# Patient Record
Sex: Female | Born: 1980
Health system: Southern US, Community
[De-identification: ages and names within clinical notes are randomized; demographics above are authoritative.]

## PROBLEM LIST (undated history)

## (undated) DIAGNOSIS — I10 Essential (primary) hypertension: Secondary | ICD-10-CM

## (undated) HISTORY — PX: APPENDECTOMY: SHX54

## (undated) HISTORY — PX: TUBAL LIGATION: SHX77

---

## 2008-03-21 ENCOUNTER — Emergency Department: Payer: Self-pay | Admitting: Emergency Medicine

## 2011-03-03 ENCOUNTER — Inpatient Hospital Stay: Payer: Self-pay

## 2011-03-05 LAB — PATHOLOGY REPORT

## 2011-03-06 DIAGNOSIS — I10 Essential (primary) hypertension: Secondary | ICD-10-CM

## 2011-05-10 ENCOUNTER — Inpatient Hospital Stay: Payer: Self-pay | Admitting: Surgery

## 2011-05-15 LAB — PATHOLOGY REPORT

## 2011-05-20 ENCOUNTER — Inpatient Hospital Stay: Payer: Self-pay | Admitting: Surgery

## 2012-06-18 ENCOUNTER — Encounter (HOSPITAL_COMMUNITY): Payer: Self-pay | Admitting: Emergency Medicine

## 2012-06-18 ENCOUNTER — Emergency Department (HOSPITAL_COMMUNITY)
Admission: EM | Admit: 2012-06-18 | Discharge: 2012-06-18 | Disposition: A | Discharge status: Home / Self Care | Payer: Self-pay | Attending: Emergency Medicine | Admitting: Emergency Medicine

## 2012-06-18 DIAGNOSIS — Z87891 Personal history of nicotine dependence: Secondary | ICD-10-CM | POA: Insufficient documentation

## 2012-06-18 DIAGNOSIS — J029 Acute pharyngitis, unspecified: Secondary | ICD-10-CM | POA: Insufficient documentation

## 2012-06-18 LAB — RAPID STREP SCREEN (MED CTR MEBANE ONLY): Streptococcus, Group A Screen (Direct): NEGATIVE

## 2012-06-18 MED ORDER — HYDROCODONE-ACETAMINOPHEN 7.5-500 MG/15ML PO SOLN: 15.0000 mL | Freq: Four times a day (QID) | ORAL | Status: DC | PRN

## 2012-06-18 MED ORDER — ACETAMINOPHEN-CODEINE 120-12 MG/5ML PO SOLN
10.0000 mL | Freq: Once | ORAL | Status: DC
  Filled 2012-06-18: qty 10

## 2012-06-18 MED ORDER — PENICILLIN V POTASSIUM 500 MG PO TABS: 500.0000 mg | ORAL_TABLET | Freq: Three times a day (TID) | ORAL | Status: AC

## 2012-06-18 MED ORDER — METHYLPREDNISOLONE SODIUM SUCC 125 MG IJ SOLR
125.0000 mg | Freq: Once | INTRAMUSCULAR | Status: AC
  Administered 2012-06-18: 125 mg via INTRAMUSCULAR
  Filled 2012-06-18: qty 2

## 2012-06-18 NOTE — ED Provider Notes (Signed)
History     CSN: [id number]  Arrival date & time 06/18/12  1941   First MD Initiated Contact with Patient 06/18/12 1959      Chief Complaint  Patient presents with  . Sore Throat    (Consider location/radiation/quality/duration/timing/severity/associated sxs/prior treatment) HPI Pt to the ED with complaints of bilateral sore throat for 3 days. She states it feels like burning and hurts to swallow. She denies having fevers, chills, nausea, vomiting and diarrhea. She denies nasal congestion or ear pain. No change in voice. VSS/NAD  History reviewed. No pertinent past medical history.  Past Surgical History  Procedure Date  . Appendectomy   . Tubal ligation     History reviewed. No pertinent family history.  History  Substance Use Topics  . Smoking status: Former Games developer  . Smokeless tobacco: Not on file   Comment: quit in 2011  . Alcohol Use: No    OB History    Grav Para Term Preterm Abortions TAB SAB Ect Mult Living                  Review of Systems  Review of Systems  Gen: no weight loss, fevers, chills, night sweats  Eyes: no discharge or drainage, no occular pain or visual changes  Nose: no epistaxis or rhinorrhea  Mouth: no dental pain, + sore throat  Neck: no neck pain  Lungs:No wheezing, coughing or hemoptysis CV: no chest pain, palpitations, dependent edema or orthopnea  Abd: no abdominal pain, nausea, vomiting  GU: no dysuria or gross hematuria  MSK:  No abnormalities  Neuro: no headache, no focal neurologic deficits  Skin: no abnormalities Psyche: negative.   Allergies  Review of patient's allergies indicates no known allergies.  Home Medications   Current Outpatient Rx  Name Route Sig Dispense Refill  . BC HEADACHE POWDER PO Oral Take 1 packet by mouth daily as needed. For pain    . HYDROCODONE-ACETAMINOPHEN 7.5-500 MG/15ML PO SOLN Oral Take 15 mLs by mouth every 6 (six) hours as needed for pain. 120 mL 0  . PENICILLIN V POTASSIUM 500 MG  PO TABS Oral Take 1 tablet (500 mg total) by mouth 3 (three) times daily. 30 tablet 0    BP 173/121  Pulse 97  Temp 98.5 F (36.9 C) (Oral)  Resp 18  SpO2 98%  LMP 05/24/2012  Physical Exam  Nursing note and vitals reviewed. Constitutional: She is oriented to person, place, and time. She appears well-developed and well-nourished. No distress.  HENT:  Head: Normocephalic and atraumatic. No trismus in the jaw.  Right Ear: Tympanic membrane, external ear and ear canal normal.  Left Ear: Tympanic membrane, external ear and ear canal normal.  Nose: Nose normal. No rhinorrhea. Right sinus exhibits no maxillary sinus tenderness and no frontal sinus tenderness. Left sinus exhibits no maxillary sinus tenderness and no frontal sinus tenderness.  Mouth/Throat: Uvula is midline and mucous membranes are normal. Normal dentition. No dental abscesses or uvula swelling. Oropharyngeal exudate and posterior oropharyngeal edema present. No posterior oropharyngeal erythema or tonsillar abscesses.       No submental edema, tongue not elevated, no trismus. No impending airway obstruction; Pt able to speak full sentences, swallow intact, no drooling, stridor, or tonsillar/uvula displacement. No palatal petechia  Eyes: Conjunctivae normal are normal.  Neck: Trachea normal, normal range of motion and full passive range of motion without pain. Neck supple. No rigidity. Normal range of motion present. No Brudzinski's sign noted.  Flexion and extension of neck without pain or difficulty. Able to breath without difficulty in extension.  Cardiovascular: Normal rate and regular rhythm.   Pulmonary/Chest: Effort normal and breath sounds normal. No stridor. No respiratory distress. She has no wheezes.  Abdominal: Soft. There is no tenderness.       No obvious evidence of splenomegaly. Non ttp.   Musculoskeletal: Normal range of motion.  Lymphadenopathy:       Head (right side): No preauricular and no posterior  auricular adenopathy present.       Head (left side): No preauricular and no posterior auricular adenopathy present.    She has cervical adenopathy.  Neurological: She is alert and oriented to person, place, and time.  Skin: Skin is warm and dry. No rash noted. She is not diaphoretic.  Psychiatric: She has a normal mood and affect.    ED Course  Procedures (including critical care time)   Labs Reviewed  RAPID STREP SCREEN   No results found.   1. Pharyngitis       MDM  Pt given shot of solumedrol in ED. Rx for Lortab elixir. Given Penicillin Rx and told not to take it unless pain lasts 3 more days then she should get it filled as this is most likely viral.  Pt has been advised of the symptoms that warrant their return to the ED. Patient has voiced understanding and has agreed to follow-up with the PCP or specialist.         Dorthula Matas, PA 06/18/12 2144

## 2012-06-18 NOTE — ED Notes (Signed)
Pt reports sore throat X 3days--feels like burning sensation; denies congestion

## 2012-06-20 NOTE — ED Provider Notes (Signed)
Medical screening examination/treatment/procedure(s) were performed by non-physician practitioner and as supervising physician I was immediately available for consultation/collaboration.   Ornella Coderre, MD 06/20/12 0649 

## 2014-08-10 ENCOUNTER — Ambulatory Visit: Payer: Self-pay | Admitting: Specialist

## 2014-10-04 ENCOUNTER — Emergency Department: Payer: Self-pay | Admitting: Emergency Medicine

## 2014-10-04 LAB — COMPREHENSIVE METABOLIC PANEL
ALBUMIN: 3.5 g/dL (ref 3.4–5.0)
ALK PHOS: 86 U/L
ALT: 52 U/L
ANION GAP: 9 (ref 7–16)
AST: 34 U/L (ref 15–37)
BUN: 12 mg/dL (ref 7–18)
Bilirubin,Total: <0.1 mg/dL — ABNORMAL LOW (ref 0.2–1.0)
Calcium, Total: 8.9 mg/dL (ref 8.5–10.1)
Chloride: 107 mmol/L (ref 98–107)
Co2: 23 mmol/L (ref 21–32)
Creatinine: 0.98 mg/dL (ref 0.60–1.30)
EGFR (Non-African Amer.): >60
GLUCOSE: 82 mg/dL (ref 65–99)
Osmolality: 276 (ref 275–301)
POTASSIUM: 4.3 mmol/L (ref 3.5–5.1)
Sodium: 139 mmol/L (ref 136–145)
TOTAL PROTEIN: 8.4 g/dL — AB (ref 6.4–8.2)

## 2014-10-04 LAB — CBC
HCT: 38.9 % (ref 35.0–47.0)
HGB: 12.5 g/dL (ref 12.0–16.0)
MCH: 26.8 pg (ref 26.0–34.0)
MCHC: 32.2 g/dL (ref 32.0–36.0)
MCV: 83 fL (ref 80–100)
Platelet: 367 10*3/uL (ref 150–440)
RBC: 4.68 10*6/uL (ref 3.80–5.20)
RDW: 15.6 % — AB (ref 11.5–14.5)
WBC: 9.3 10*3/uL (ref 3.6–11.0)

## 2014-10-04 LAB — TROPONIN I: Troponin-I: 0.02 ng/mL

## 2014-11-18 ENCOUNTER — Ambulatory Visit: Payer: Self-pay | Admitting: Specialist

## 2014-11-21 ENCOUNTER — Ambulatory Visit: Payer: Self-pay | Admitting: Specialist

## 2014-12-02 ENCOUNTER — Ambulatory Visit: Payer: Self-pay | Admitting: Gastroenterology

## 2015-01-16 LAB — SURGICAL PATHOLOGY

## 2015-03-19 IMAGING — NM NUCLEAR MEDICINE HEPATOHBILIARY INCLUDE GB
3 series · 18 of 18 positions shown · non-contrast
Comparison: None.

CLINICAL DATA: Epigastric pain with nausea and vomiting

EXAM:
NUCLEAR MEDICINE HEPATOBILIARY IMAGING WITH GALLBLADDER EF
TECHNIQUE: Sequential images of the abdomen were obtained [DATE] minutes
following intravenous administration of radiopharmaceutical. After
slow intravenous infusion of 2.16 micrograms Cholecystokinin,
gallbladder ejection fraction was determined.
RADIOPHARMACEUTICALS:  5.12 Millicurie 9c-HHm Choletec

[Series 1000: gallbladder ef dynamic (results) · 4.80mm/px · 6 of 90 frames shown]
[frame 8/90]
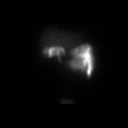
[frame 23/90]
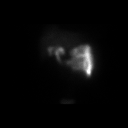
[frame 38/90]
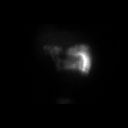
[frame 53/90]
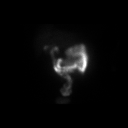
[frame 68/90]
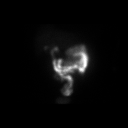
[frame 83/90]
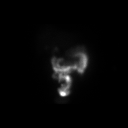

[Series 1000: hepatobiliary dynamic · 9.59mm/px · 6 of 60 frames shown]
[frame 6/60]
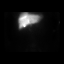
[frame 16/60]
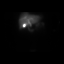
[frame 26/60]
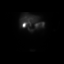
[frame 36/60]
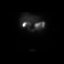
[frame 46/60]
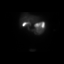
[frame 56/60]
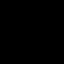

[Series 1000: gallbladder ef dynamic · 4.80mm/px · 6 of 90 frames shown]
[frame 8/90]
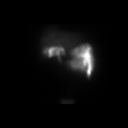
[frame 23/90]
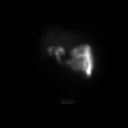
[frame 38/90]
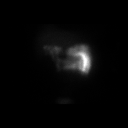
[frame 53/90]
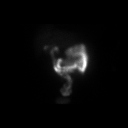
[frame 68/90]
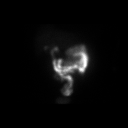
[frame 83/90]
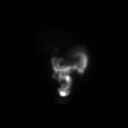

[18 of 18 positions shown; findings below may reference images not displayed]

FINDINGS: There is adequate uptake of radioactive tracer throughout the liver
immediately following injection. Visualization of the biliary tree
and gallbladder are noted at 10 minutes. Small bowel activity is
also noted at 10 minutes. Progressive filling of the gallbladder is
noted.. The gallbladder ejection fraction is calculated at 100%
following CCK injection.

The patient did not experience symptoms during CCK infusion.
IMPRESSION: Normal uptake and excretion of biliary tracer.

Normal gallbladder ejection fraction.

## 2015-04-25 ENCOUNTER — Ambulatory Visit: Payer: BLUE CROSS/BLUE SHIELD | Attending: Internal Medicine

## 2015-04-25 DIAGNOSIS — R0683 Snoring: Secondary | ICD-10-CM | POA: Diagnosis present

## 2015-04-25 DIAGNOSIS — G4733 Obstructive sleep apnea (adult) (pediatric): Secondary | ICD-10-CM | POA: Diagnosis not present

## 2015-04-25 DIAGNOSIS — Z9981 Dependence on supplemental oxygen: Secondary | ICD-10-CM | POA: Diagnosis not present

## 2015-04-25 DIAGNOSIS — E669 Obesity, unspecified: Secondary | ICD-10-CM | POA: Diagnosis not present

## 2015-04-25 DIAGNOSIS — Z9989 Dependence on other enabling machines and devices: Secondary | ICD-10-CM | POA: Diagnosis not present

## 2015-04-25 DIAGNOSIS — R0602 Shortness of breath: Secondary | ICD-10-CM | POA: Diagnosis present

## 2015-04-25 DIAGNOSIS — Z6841 Body Mass Index (BMI) 40.0 and over, adult: Secondary | ICD-10-CM | POA: Diagnosis not present

## 2015-10-16 ENCOUNTER — Emergency Department
Admission: EM | Admit: 2015-10-16 | Discharge: 2015-10-16 | Disposition: A | Discharge status: Home / Self Care | Payer: BLUE CROSS/BLUE SHIELD | Attending: Emergency Medicine | Admitting: Emergency Medicine

## 2015-10-16 ENCOUNTER — Encounter: Payer: Self-pay | Admitting: Emergency Medicine

## 2015-10-16 DIAGNOSIS — Z87891 Personal history of nicotine dependence: Secondary | ICD-10-CM | POA: Diagnosis not present

## 2015-10-16 DIAGNOSIS — F419 Anxiety disorder, unspecified: Secondary | ICD-10-CM | POA: Diagnosis not present

## 2015-10-16 DIAGNOSIS — L02412 Cutaneous abscess of left axilla: Secondary | ICD-10-CM | POA: Diagnosis present

## 2015-10-16 DIAGNOSIS — Z792 Long term (current) use of antibiotics: Secondary | ICD-10-CM | POA: Diagnosis not present

## 2015-10-16 DIAGNOSIS — I10 Essential (primary) hypertension: Secondary | ICD-10-CM | POA: Diagnosis not present

## 2015-10-16 DIAGNOSIS — L0291 Cutaneous abscess, unspecified: Secondary | ICD-10-CM

## 2015-10-16 HISTORY — DX: Essential (primary) hypertension: I10

## 2015-10-16 MED ORDER — SULFAMETHOXAZOLE-TRIMETHOPRIM 800-160 MG PO TABS: 1.0000 | ORAL_TABLET | Freq: Two times a day (BID) | ORAL | Status: AC

## 2015-10-16 MED ORDER — LIDOCAINE-EPINEPHRINE (PF) 1 %-1:200000 IJ SOLN
INTRAMUSCULAR | Status: AC
  Administered 2015-10-16: 18:00:00
  Filled 2015-10-16: qty 30

## 2015-10-16 MED ORDER — HYDROCODONE-ACETAMINOPHEN 5-325 MG PO TABS: 1.0000 | ORAL_TABLET | ORAL | Status: AC | PRN

## 2015-10-16 NOTE — ED Notes (Signed)
Abscess drained and packing inserted by dr Cyril Loosen,   Tolerated well.  Dressing applied to left axilla D/c inst to pt.

## 2015-10-16 NOTE — ED Notes (Signed)
Developed a possible abscess u der left arm about 6 days ago

## 2015-10-16 NOTE — ED Notes (Signed)
Pt has abscess to left axilla for 6 days.  No drainage now.  Swelling and pain to area.  No fevers.

## 2015-10-16 NOTE — ED Provider Notes (Signed)
[facility] Emergency Department Provider Note  ____________________________________________  Time seen: On arrival  I have reviewed the triage vital signs and the nursing notes.   HISTORY  Chief Complaint Abscess    HPI Jackie Stewart is a 35 y.o. female who presents with complaints of abscess to the left axilla. She reports she's never had this before. She reports it is been there for about 6 days and getting worse and worse. She denies fevers or chills.    Past Medical History  Diagnosis Date  . Hypertension     There are no active problems to display for this patient.   Past Surgical History  Procedure Laterality Date  . Appendectomy    . Tubal ligation      Current Outpatient Rx  Name  Route  Sig  Dispense  Refill  . Aspirin-Salicylamide-Caffeine (BC HEADACHE POWDER PO)   Oral   Take 1 packet by mouth daily as needed. For pain         . HYDROcodone-acetaminophen (NORCO/VICODIN) 5-325 MG tablet   Oral   Take 1 tablet by mouth every 4 (four) hours as needed for moderate pain.   20 tablet   0   . penicillin v potassium (VEETID) 500 MG tablet   Oral   Take 1 tablet (500 mg total) by mouth 3 (three) times daily.   30 tablet   0   . sulfamethoxazole-trimethoprim (BACTRIM DS,SEPTRA DS) 800-160 MG tablet   Oral   Take 1 tablet by mouth 2 (two) times daily.   14 tablet   0     Allergies Review of patient's allergies indicates no known allergies.  No family history on file.  Social History Social History  Substance Use Topics  . Smoking status: Former Games developer  . Smokeless tobacco: None     Comment: quit in 2011  . Alcohol Use: No    Review of Systems  Constitutional: Negative for fever.      Skin: Positive for redness and abscess Psych: Positive for anxiety   ____________________________________________   PHYSICAL EXAM:  VITAL SIGNS: ED Triage Vitals  Enc Vitals Group     BP 10/16/15 1647 161/101 mmHg     Pulse Rate 10/16/15 1647 98     Resp 10/16/15 1647 16     Temp 10/16/15 1647 98.3 F (36.8 C)     Temp Source 10/16/15 1647 Oral     SpO2 10/16/15 1647 97 %     Weight 10/16/15 1647 208 lb (94.348 kg)     Height 10/16/15 1647  (1.575 m)     Head Cir --      Peak Flow --      Pain Score 10/16/15 1645 8     Pain Loc --      Pain Edu? --      Excl. in GC? --      Constitutional: Alert and oriented. Well appearing and in no distress. Eyes: Conjunctivae are normal.  ENT   Head: Normocephalic and atraumatic.   Mouth/Throat: Mucous membranes are moist. Cardiovascular: Normal rate, regular rhythm.  Respiratory: Normal respiratory effort without tachypnea nor retractions.   Neurologic:  Normal speech and language. No gross focal neurologic deficits are appreciated. Skin:  Skin is warm, dry and intact. Proximally 4 x 4 centimeter Abscess with induration and fluctuation noted to the left axilla Psychiatric: Mood and affect are normal. Patient exhibits appropriate insight and judgment.  ____________________________________________    LABS (pertinent positives/negatives)  Labs  Reviewed - No data to display  ____________________________________________     ____________________________________________    RADIOLOGY I have personally reviewed any xrays that were ordered on this patient: None  ____________________________________________   PROCEDURES  Procedure(s) performed: yes  INCISION AND DRAINAGE Performed by: Jene Every Consent: Verbal consent obtained. Risks and benefits: risks, benefits and alternatives were discussed Type: abscess  Body area: Left axilla  Anesthesia: local infiltration  Incision was made with a scalpel.  Local anesthetic: lidocaine 1 % with epinephrine  Anesthetic total: 5 ml  Complexity: complex Blunt dissection to break up loculations  Drainage: purulent  Drainage amount: Large   Packing material: 1/4 in iodoform  gauze  Patient tolerance: Patient tolerated the procedure well with no immediate complications.      ____________________________________________   INITIAL IMPRESSION / ASSESSMENT AND PLAN / ED COURSE  Pertinent labs & imaging results that were available during my care of the patient were reviewed by me and considered in my medical decision making (see chart for details).  Abscess drained by me. Patient felt significant better after drainage. I have started her on antibiotics and recommended wound check in 2 days  ____________________________________________   FINAL CLINICAL IMPRESSION(S) / ED DIAGNOSES  Final diagnoses:  Abscess     Jene Every, MD 10/16/15 2150

## 2015-10-16 NOTE — Discharge Instructions (Signed)
Abscess °An abscess is an infected area that contains a collection of pus and debris. It can occur in almost any part of the body. An abscess is also known as a furuncle or boil. °CAUSES  °An abscess occurs when tissue gets infected. This can occur from blockage of oil or sweat glands, infection of hair follicles, or a minor injury to the skin. As the body tries to fight the infection, pus collects in the area and creates pressure under the skin. This pressure causes pain. People with weakened immune systems have difficulty fighting infections and get certain abscesses more often.  °SYMPTOMS °Usually an abscess develops on the skin and becomes a painful mass that is red, warm, and tender. If the abscess forms under the skin, you may feel a moveable soft area under the skin. Some abscesses break open (rupture) on their own, but most will continue to get worse without care. The infection can spread deeper into the body and eventually into the bloodstream, causing you to feel ill.  °DIAGNOSIS  °Your caregiver will take your medical history and perform a physical exam. A sample of fluid may also be taken from the abscess to determine what is causing your infection. °TREATMENT  °Your caregiver may prescribe antibiotic medicines to fight the infection. However, taking antibiotics alone usually does not cure an abscess. Your caregiver may need to make a small cut (incision) in the abscess to drain the pus. In some cases, gauze is packed into the abscess to reduce pain and to continue draining the area. °HOME CARE INSTRUCTIONS  °· Only take over-the-counter or prescription medicines for pain, discomfort, or fever as directed by your caregiver. °· If you were prescribed antibiotics, take them as directed. Finish them even if you start to feel better. °· If gauze is used, follow your caregiver's directions for changing the gauze. °· To avoid spreading the infection: °· Keep your draining abscess covered with a  bandage. °· Wash your hands well. °· Do not share personal care items, towels, or whirlpools with others. °· Avoid skin contact with others. °· Keep your skin and clothes clean around the abscess. °· Keep all follow-up appointments as directed by your caregiver. °SEEK MEDICAL CARE IF:  °· You have increased pain, swelling, redness, fluid drainage, or bleeding. °· You have muscle aches, chills, or a general ill feeling. °· You have a fever. °MAKE SURE YOU:  °· Understand these instructions. °· Will watch your condition. °· Will get help right away if you are not doing well or get worse. °  °This information is not intended to replace advice given to you by your health care provider. Make sure you discuss any questions you have with your health care provider. °  °Document Released: 06/19/2005 Document Revised: 03/10/2012 Document Reviewed: 11/22/2011 °Elsevier Interactive Patient Education ©2016 [employer] ° °Incision and Drainage °Incision and drainage is a procedure in which a sac-like structure (cystic structure) is opened and drained. The area to be drained usually contains material such as pus, fluid, or blood.  °LET YOUR CAREGIVER KNOW ABOUT:  °· Allergies to medicine. °· Medicines taken, including vitamins, herbs, eyedrops, over-the-counter medicines, and creams. °· Use of steroids (by mouth or creams). °· Previous problems with anesthetics or numbing medicines. °· History of bleeding problems or blood clots. °· Previous surgery. °· Other health problems, including diabetes and kidney problems. °· Possibility of pregnancy, if this applies. °RISKS AND COMPLICATIONS °· Pain. °· Bleeding. °· Scarring. °· Infection. °BEFORE THE PROCEDURE  °  You may need to have an ultrasound or other imaging tests to see how large or deep your cystic structure is. Blood tests may also be used to determine if you have an infection or how severe the infection is. You may need to have a tetanus shot. °PROCEDURE  °The affected area  is cleaned with a cleaning fluid. The cyst area will then be numbed with a medicine (local anesthetic). A small incision will be made in the cystic structure. A syringe or catheter may be used to drain the contents of the cystic structure, or the contents may be squeezed out. The area will then be flushed with a cleansing solution. After cleansing the area, it is often gently packed with a gauze or another wound dressing. Once it is packed, it will be covered with gauze and tape or some other type of wound dressing.  °AFTER THE PROCEDURE  °· Often, you will be allowed to go home right after the procedure. °· You may be given antibiotic medicine to prevent or heal an infection. °· If the area was packed with gauze or some other wound dressing, you will likely need to come back in 1 to 2 days to get it removed. °· The area should heal in about 14 days. °  °This information is not intended to replace advice given to you by your health care provider. Make sure you discuss any questions you have with your health care provider. °  °Document Released: 03/05/2001 Document Revised: 03/10/2012 Document Reviewed: 11/04/2011 °Elsevier Interactive Patient Education ©2016 [employer] ° °

## 2016-07-18 ENCOUNTER — Emergency Department
Admission: EM | Admit: 2016-07-18 | Discharge: 2016-07-19 | Disposition: A | Discharge status: Home / Self Care | Payer: BLUE CROSS/BLUE SHIELD | Attending: Emergency Medicine | Admitting: Emergency Medicine

## 2016-07-18 DIAGNOSIS — Z791 Long term (current) use of non-steroidal anti-inflammatories (NSAID): Secondary | ICD-10-CM | POA: Diagnosis not present

## 2016-07-18 DIAGNOSIS — R1111 Vomiting without nausea: Secondary | ICD-10-CM

## 2016-07-18 DIAGNOSIS — Z792 Long term (current) use of antibiotics: Secondary | ICD-10-CM | POA: Insufficient documentation

## 2016-07-18 DIAGNOSIS — I10 Essential (primary) hypertension: Secondary | ICD-10-CM | POA: Insufficient documentation

## 2016-07-18 DIAGNOSIS — Z87891 Personal history of nicotine dependence: Secondary | ICD-10-CM | POA: Insufficient documentation

## 2016-07-18 DIAGNOSIS — Z7982 Long term (current) use of aspirin: Secondary | ICD-10-CM | POA: Diagnosis not present

## 2016-07-18 DIAGNOSIS — E860 Dehydration: Secondary | ICD-10-CM | POA: Diagnosis not present

## 2016-07-18 DIAGNOSIS — R51 Headache: Secondary | ICD-10-CM | POA: Diagnosis present

## 2016-07-18 LAB — LIPASE, BLOOD: Lipase: 18 U/L (ref 11–51)

## 2016-07-18 LAB — COMPREHENSIVE METABOLIC PANEL
ALK PHOS: 63 U/L (ref 38–126)
ALT: 23 U/L (ref 14–54)
ANION GAP: 12 (ref 5–15)
AST: 20 U/L (ref 15–41)
Albumin: 4.5 g/dL (ref 3.5–5.0)
BILIRUBIN TOTAL: 0.3 mg/dL (ref 0.3–1.2)
BUN: 32 mg/dL — ABNORMAL HIGH (ref 6–20)
CALCIUM: 10 mg/dL (ref 8.9–10.3)
CO2: 28 mmol/L (ref 22–32)
Chloride: 96 mmol/L — ABNORMAL LOW (ref 101–111)
Creatinine, Ser: 2.18 mg/dL — ABNORMAL HIGH (ref 0.44–1.00)
GFR calc Af Amer: 33 mL/min — ABNORMAL LOW (ref >60)
GFR, EST NON AFRICAN AMERICAN: 28 mL/min — AB (ref >60)
Glucose, Bld: 94 mg/dL (ref 65–99)
POTASSIUM: 3.1 mmol/L — AB (ref 3.5–5.1)
Sodium: 136 mmol/L (ref 135–145)
TOTAL PROTEIN: 9.1 g/dL — AB (ref 6.5–8.1)

## 2016-07-18 LAB — CBC
HEMATOCRIT: 42.6 % (ref 35.0–47.0)
HEMOGLOBIN: 13.9 g/dL (ref 12.0–16.0)
MCH: 27.5 pg (ref 26.0–34.0)
MCHC: 32.6 g/dL (ref 32.0–36.0)
MCV: 84.3 fL (ref 80.0–100.0)
Platelets: 359 10*3/uL (ref 150–440)
RBC: 5.05 MIL/uL (ref 3.80–5.20)
RDW: 14.8 % — AB (ref 11.5–14.5)
WBC: 10.5 10*3/uL (ref 3.6–11.0)

## 2016-07-18 LAB — TROPONIN I

## 2016-07-18 MED ORDER — SODIUM CHLORIDE 0.9 % IV BOLUS (SEPSIS)
2000.0000 mL | Freq: Once | INTRAVENOUS | Status: AC
  Administered 2016-07-18: 2000 mL via INTRAVENOUS

## 2016-07-18 MED ORDER — ONDANSETRON HCL 4 MG/2ML IJ SOLN
INTRAMUSCULAR | Status: AC
  Administered 2016-07-18: 4 mg via INTRAVENOUS
  Filled 2016-07-18: qty 2

## 2016-07-18 MED ORDER — ONDANSETRON HCL 4 MG/2ML IJ SOLN
4.0000 mg | Freq: Once | INTRAMUSCULAR | Status: AC
  Administered 2016-07-18: 4 mg via INTRAVENOUS

## 2016-07-18 NOTE — ED Notes (Signed)
Pt unable to give urine specimen at this time. Cup given in a bag, instructed to bring to the front when did.

## 2016-07-18 NOTE — ED Notes (Signed)
Pt resting. Call bell in reach, stretcher in lowest position.

## 2016-07-18 NOTE — ED Triage Notes (Addendum)
Patient ambulatory to triage with steady gait, without difficulty or distress noted; pt reports nausea "for days", V x 1, "sharp pain to jaws intermittently, burning in my ears"; denies c/o pain at present

## 2016-07-18 NOTE — ED Notes (Signed)
MD at bedside. 

## 2016-07-18 NOTE — ED Provider Notes (Signed)
[facility] Emergency Department Provider Note   First MD Initiated Contact with Patient 07/18/16 2300     (approximate)  I have reviewed the triage vital signs and the nursing notes.   HISTORY  Chief Complaint Nausea and Jaw Pain   HPI Jackie Stewart is a 35 y.o. female history of hypertension presents emergency Department 1 day history of bilateral jaw pain vomiting and headache. Patient states that she had ran out of her antihypertensives for approximately one week and had systolic blood pressures at home exceeding 200. Patient stated that she obtained her blood pressure medications yesterday and resume taking them however this morning while at work she started experiencing headache bilateral jaw pain and subsequently multiple episodes of vomiting. Patient denies any abdominal pain no chest pain or shortness of breath.   Past Medical History:  Diagnosis Date  . Hypertension     There are no active problems to display for this patient.   Past Surgical History:  Procedure Laterality Date  . APPENDECTOMY    . TUBAL LIGATION      Prior to Admission medications   Medication Sig Start Date End Date Taking? Authorizing Provider  Aspirin-Salicylamide-Caffeine (BC HEADACHE POWDER PO) Take 1 packet by mouth daily as needed. For pain    Historical Provider, MD  HYDROcodone-acetaminophen (NORCO/VICODIN) 5-325 MG tablet Take 1 tablet by mouth every 4 (four) hours as needed for moderate pain. 10/16/15   Jene Every, MD  penicillin v potassium (VEETID) 500 MG tablet Take 1 tablet (500 mg total) by mouth 3 (three) times daily. 06/18/12   Tiffany Neva Seat, PA-C  sulfamethoxazole-trimethoprim (BACTRIM DS,SEPTRA DS) 800-160 MG tablet Take 1 tablet by mouth 2 (two) times daily. 10/16/15   Jene Every, MD    Allergies No known drug allergies No family history on file.  Social History Social History  Substance Use Topics  . Smoking status: Former Games developer  .  Smokeless tobacco: Not on file     Comment: quit in 2011  . Alcohol use No    Review of Systems Constitutional: No fever/chills Eyes: No visual changes. ENT: No sore throat.Positive for bilateral jaw pain Cardiovascular: Denies chest pain. Respiratory: Denies shortness of breath. Gastrointestinal: No abdominal pain.  No nausea, no vomiting.  No diarrhea.  No constipation. Genitourinary: Negative for dysuria. Musculoskeletal: Negative for back pain. Skin: Negative for rash. Neurological: Negative for headaches, focal weakness or numbness.  10-point ROS otherwise negative.  ____________________________________________   PHYSICAL EXAM:  VITAL SIGNS: ED Triage Vitals [07/18/16 2136]  Enc Vitals Group     BP 122/77     Pulse Rate 72     Resp 18     Temp 98.2 F (36.8 C)     Temp Source Oral     SpO2 98 %     Weight 210 lb (95.3 kg)     Height 5\' 2"  (1.575 m)     Head Circumference      Peak Flow      Pain Score      Pain Loc      Pain Edu?      Excl. in GC?     Constitutional: Alert and oriented. Well appearing and in no acute distress. Eyes: Conjunctivae are normal. PERRL. EOMI. Head: Atraumatic. Ears:  Healthy appearing ear canals and TMs bilaterally Nose: No congestion/rhinnorhea. Mouth/Throat: Mucous membranes are dry.  Oropharynx non-erythematous. Neck: No stridor.  No meningeal signs.  No cervical spine tenderness to palpation. Cardiovascular: Normal rate,  regular rhythm. Good peripheral circulation. Grossly normal heart sounds. Respiratory: Normal respiratory effort.  No retractions. Lungs CTAB. Gastrointestinal: Soft and nontender. No distention.  Musculoskeletal: No lower extremity tenderness nor edema. No gross deformities of extremities. Neurologic:  Normal speech and language. No gross focal neurologic deficits are appreciated.  Skin:  Skin is warm, dry and intact. No rash noted. Psychiatric: Mood and affect are normal. Speech and behavior are  normal.  ____________________________________________   LABS (all labs ordered are listed, but only abnormal results are displayed)  Labs Reviewed  COMPREHENSIVE METABOLIC PANEL - Abnormal; Notable for the following:       Result Value   Potassium 3.1 (*)    Chloride 96 (*)    BUN 32 (*)    Creatinine, Ser 2.18 (*)    Total Protein 9.1 (*)    GFR calc non Af Amer 28 (*)    GFR calc Af Amer 33 (*)    All other components within normal limits  CBC - Abnormal; Notable for the following:    RDW 14.8 (*)    All other components within normal limits  URINALYSIS COMPLETEWITH MICROSCOPIC ([facility] ONLY) - Abnormal; Notable for the following:    Color, Urine YELLOW (*)    APPearance CLEAR (*)    Bacteria, UA RARE (*)    Squamous Epithelial / LPF 0-5 (*)    All other components within normal limits  COMPREHENSIVE METABOLIC PANEL - Abnormal; Notable for the following:    Glucose, Bld 121 (*)    BUN 30 (*)    Creatinine, Ser 1.54 (*)    Calcium 8.8 (*)    GFR calc non Af Amer 43 (*)    GFR calc Af Amer 50 (*)    All other components within normal limits  LIPASE, BLOOD  TROPONIN I  POC URINE PREG, ED  POCT PREGNANCY, URINE   ____________________________________________  EKG  ED ECG REPORT I, [place] N Satrina Magallanes, the attending physician, personally viewed and interpreted this ECG.   Date: 07/19/2016  EKG Time: 9:52 PM  Rate: 70  Rhythm: Normal sinus rhythm  Axis: Normal  Intervals: Normal  ST&T Change: None  ____________________________________________  RADIOLOGY I, [place] N Calloway Andrus, personally viewed and evaluated these images (plain radiographs) as part of my medical decision making, as well as reviewing the written report by the radiologist.  Ct Angio Head W Or Wo Contrast  Result Date: 07/19/2016 CLINICAL DATA:  Initial evaluation for acute headache. EXAM: CT ANGIOGRAPHY HEAD AND NECK TECHNIQUE: Multidetector CT imaging of the head and neck was performed using the  standard protocol during bolus administration of intravenous contrast. Multiplanar CT image reconstructions and MIPs were obtained to evaluate the vascular anatomy. Carotid stenosis measurements (when applicable) are obtained utilizing NASCET criteria, using the distal internal carotid diameter as the denominator. CONTRAST:  75 cc Isovue 370. COMPARISON:  None. FINDINGS: CT HEAD FINDINGS Brain: Cerebral volume normal. No acute intracranial hemorrhage. Gray-white matter differentiation well maintained. No evidence for acute infarct. No mass lesion, midline shift, or mass effect. No hydrocephalus. No extra-axial fluid collection. Vascular: No hyperdense vessel. Skull: Scalp soft tissues normal.  Calvarium intact. Sinuses: Visualized paranasal sinuses and mastoid air cells are clear. Orbits: Visualized globes and orbits within normal limits.  The CTA NECK FINDINGS Aortic arch: Visualized aortic arch of normal caliber with normal branch pattern. No high-grade stenosis at the origin of the great vessels. Visualized subclavian arteries widely patent. Right carotid system: Origin of the right common carotid  artery not well visualized due to streak artifact from IV contrast within adjacent venous structures. Right common carotid artery widely patent distally to the bifurcation. Right ICA widely patent from the bifurcation to the skullbase. No stenosis, dissection, or vascular occlusion within the right carotid artery system. Right external carotid artery and its branches within normal limits. Left carotid system: Left common carotid artery widely patent from its origin to the bifurcation. Left ICA widely patent from the bifurcation to the skullbase. No dissection, stenosis, or vascular occlusion within the left carotid artery system. Left external carotid artery and its branches within normal limits. Vertebral arteries: Both of the vertebral arteries arise from the subclavian arteries. Vertebral arteries widely patent  without stenosis, dissection, or occlusion. Skeleton: No acute osseous abnormality. No worrisome lytic or blastic osseous lesions. Other neck: Soft tissues of the neck demonstrate no acute abnormality. Thyroid grossly unremarkable. Scattered mildly prominent lymph nodes present within the neck bilaterally, measuring up to the upper limits of normal. No pathologically enlarged lymph nodes identified. Upper chest: Visualized mediastinum within normal limits. Visualized lungs are clear. Review of the MIP images confirms the above findings CTA HEAD FINDINGS Anterior circulation: Petrous, cavernous, and supraclinoid segments of the internal carotid arteries are widely patent. A1 segments patent. Anterior communicating artery normal. Anterior cerebral arteries well opacified to their distal aspects. M1 segments patent without stenosis or occlusion. MCA bifurcations normal. Distal MCA branches well opacified and symmetric. Posterior circulation: Vertebral arteries patent to the vertebrobasilar junction. Posterior inferior cerebellar arteries patent bilaterally. Basilar artery widely patent. Superior cerebellar arteries patent bilaterally. PCA is well opacified to their distal aspects. Small right posterior communicating artery noted. Venous sinuses: Patent. Anatomic variants: No significant anatomic variant. No aneurysm or vascular malformation. Delayed phase: No pathologic enhancement. Review of the MIP images confirms the above findings IMPRESSION: Normal CTA of the head and neck. Electronically Signed   By: Rise Mu M.D.   On: 07/19/2016 04:29   Ct Angio Neck W And/or Wo Contrast  Result Date: 07/19/2016 CLINICAL DATA:  Initial evaluation for acute headache. EXAM: CT ANGIOGRAPHY HEAD AND NECK TECHNIQUE: Multidetector CT imaging of the head and neck was performed using the standard protocol during bolus administration of intravenous contrast. Multiplanar CT image reconstructions and MIPs were obtained to  evaluate the vascular anatomy. Carotid stenosis measurements (when applicable) are obtained utilizing NASCET criteria, using the distal internal carotid diameter as the denominator. CONTRAST:  75 cc Isovue 370. COMPARISON:  None. FINDINGS: CT HEAD FINDINGS Brain: Cerebral volume normal. No acute intracranial hemorrhage. Gray-white matter differentiation well maintained. No evidence for acute infarct. No mass lesion, midline shift, or mass effect. No hydrocephalus. No extra-axial fluid collection. Vascular: No hyperdense vessel. Skull: Scalp soft tissues normal.  Calvarium intact. Sinuses: Visualized paranasal sinuses and mastoid air cells are clear. Orbits: Visualized globes and orbits within normal limits.  The CTA NECK FINDINGS Aortic arch: Visualized aortic arch of normal caliber with normal branch pattern. No high-grade stenosis at the origin of the great vessels. Visualized subclavian arteries widely patent. Right carotid system: Origin of the right common carotid artery not well visualized due to streak artifact from IV contrast within adjacent venous structures. Right common carotid artery widely patent distally to the bifurcation. Right ICA widely patent from the bifurcation to the skullbase. No stenosis, dissection, or vascular occlusion within the right carotid artery system. Right external carotid artery and its branches within normal limits. Left carotid system: Left common carotid artery widely patent from its  origin to the bifurcation. Left ICA widely patent from the bifurcation to the skullbase. No dissection, stenosis, or vascular occlusion within the left carotid artery system. Left external carotid artery and its branches within normal limits. Vertebral arteries: Both of the vertebral arteries arise from the subclavian arteries. Vertebral arteries widely patent without stenosis, dissection, or occlusion. Skeleton: No acute osseous abnormality. No worrisome lytic or blastic osseous lesions. Other  neck: Soft tissues of the neck demonstrate no acute abnormality. Thyroid grossly unremarkable. Scattered mildly prominent lymph nodes present within the neck bilaterally, measuring up to the upper limits of normal. No pathologically enlarged lymph nodes identified. Upper chest: Visualized mediastinum within normal limits. Visualized lungs are clear. Review of the MIP images confirms the above findings CTA HEAD FINDINGS Anterior circulation: Petrous, cavernous, and supraclinoid segments of the internal carotid arteries are widely patent. A1 segments patent. Anterior communicating artery normal. Anterior cerebral arteries well opacified to their distal aspects. M1 segments patent without stenosis or occlusion. MCA bifurcations normal. Distal MCA branches well opacified and symmetric. Posterior circulation: Vertebral arteries patent to the vertebrobasilar junction. Posterior inferior cerebellar arteries patent bilaterally. Basilar artery widely patent. Superior cerebellar arteries patent bilaterally. PCA is well opacified to their distal aspects. Small right posterior communicating artery noted. Venous sinuses: Patent. Anatomic variants: No significant anatomic variant. No aneurysm or vascular malformation. Delayed phase: No pathologic enhancement. Review of the MIP images confirms the above findings IMPRESSION: Normal CTA of the head and neck. Electronically Signed   By: Rise Mu M.D.   On: 07/19/2016 04:29     Procedures     INITIAL IMPRESSION / ASSESSMENT AND PLAN / ED COURSE  Pertinent labs & imaging results that were available during my care of the patient were reviewed by me and considered in my medical decision making (see chart for details).  Patient clinically dehydrated and creatinine 2.18 with a BUN of 32. Following 2 L IV normal saline patient BUN decreased at 30 with a creatinine 1.54. Patient received additional liter of normal saline  Clinical Course     ____________________________________________  FINAL CLINICAL IMPRESSION(S) / ED DIAGNOSES  Final diagnoses:  Dehydration  Non-intractable vomiting without nausea, unspecified vomiting type     MEDICATIONS GIVEN DURING THIS VISIT:  Medications  ondansetron (ZOFRAN) injection 4 mg (4 mg Intravenous Given 07/18/16 2354)  sodium chloride 0.9 % bolus 2,000 mL (0 mLs Intravenous Stopped 07/19/16 0210)  sodium chloride 0.9 % bolus 1,000 mL (1,000 mLs Intravenous New Bag/Given 07/19/16 0341)  iopamidol (ISOVUE-370) 76 % injection 75 mL (75 mLs Intravenous Contrast Given 07/19/16 0300)     NEW OUTPATIENT MEDICATIONS STARTED DURING THIS VISIT:  New Prescriptions   No medications on file    Modified Medications   No medications on file    Discontinued Medications   No medications on file     Note:  This document was prepared using Dragon voice recognition software and may include unintentional dictation errors.    Darci Current, MD 07/19/16 [phone]

## 2016-07-19 ENCOUNTER — Encounter: Payer: Self-pay | Admitting: Radiology

## 2016-07-19 ENCOUNTER — Emergency Department: Payer: BLUE CROSS/BLUE SHIELD

## 2016-07-19 LAB — COMPREHENSIVE METABOLIC PANEL
ALT: 21 U/L (ref 14–54)
AST: 18 U/L (ref 15–41)
Albumin: 4 g/dL (ref 3.5–5.0)
Alkaline Phosphatase: 53 U/L (ref 38–126)
Anion gap: 7 (ref 5–15)
BUN: 30 mg/dL — ABNORMAL HIGH (ref 6–20)
CHLORIDE: 103 mmol/L (ref 101–111)
CO2: 29 mmol/L (ref 22–32)
CREATININE: 1.54 mg/dL — AB (ref 0.44–1.00)
Calcium: 8.8 mg/dL — ABNORMAL LOW (ref 8.9–10.3)
GFR calc non Af Amer: 43 mL/min — ABNORMAL LOW (ref >60)
GFR, EST AFRICAN AMERICAN: 50 mL/min — AB (ref >60)
Glucose, Bld: 121 mg/dL — ABNORMAL HIGH (ref 65–99)
Potassium: 3.7 mmol/L (ref 3.5–5.1)
SODIUM: 139 mmol/L (ref 135–145)
Total Bilirubin: 0.6 mg/dL (ref 0.3–1.2)
Total Protein: 7.9 g/dL (ref 6.5–8.1)

## 2016-07-19 LAB — URINALYSIS COMPLETE WITH MICROSCOPIC (ARMC ONLY)
Bilirubin Urine: NEGATIVE
Glucose, UA: NEGATIVE mg/dL
Hgb urine dipstick: NEGATIVE
Ketones, ur: NEGATIVE mg/dL
LEUKOCYTES UA: NEGATIVE
Nitrite: NEGATIVE
PROTEIN: NEGATIVE mg/dL
SPECIFIC GRAVITY, URINE: 1.013 (ref 1.005–1.030)
pH: 6 (ref 5.0–8.0)

## 2016-07-19 LAB — POCT PREGNANCY, URINE: Preg Test, Ur: NEGATIVE

## 2016-07-19 MED ORDER — IOPAMIDOL (ISOVUE-370) INJECTION 76%
75.0000 mL | Freq: Once | INTRAVENOUS | Status: AC | PRN
  Administered 2016-07-19: 75 mL via INTRAVENOUS

## 2016-07-19 MED ORDER — SODIUM CHLORIDE 0.9 % IV BOLUS (SEPSIS)
1000.0000 mL | Freq: Once | INTRAVENOUS | Status: AC
  Administered 2016-07-19: 1000 mL via INTRAVENOUS

## 2016-07-19 MED ORDER — ONDANSETRON 4 MG PO TBDP: 4.0000 mg | ORAL_TABLET | Freq: Three times a day (TID) | ORAL | 0 refills | Status: AC | PRN

## 2016-07-19 NOTE — ED Notes (Signed)
Pt snoring, asleep on stretcher. Warm blanket covering her.

## 2016-07-19 NOTE — ED Notes (Signed)
Pt asked for a urine specimen but states she was unable right now and would call out when she felt she could go.

## 2016-07-19 NOTE — ED Notes (Signed)
Pt discharged to home.  Family member driving.  Discharge instructions reviewed.  Verbalized understanding.  No questions or concerns at this time.  Teach back verified.  Pt in NAD.  No items left in ED.   

## 2019-09-14 ENCOUNTER — Ambulatory Visit: Payer: BLUE CROSS/BLUE SHIELD | Attending: Internal Medicine

## 2019-09-14 DIAGNOSIS — U071 COVID-19: Secondary | ICD-10-CM

## 2019-09-15 LAB — NOVEL CORONAVIRUS, NAA: SARS-CoV-2, NAA: NOT DETECTED

## 2020-08-20 ENCOUNTER — Other Ambulatory Visit: Payer: Self-pay | Admitting: Primary Care

## 2020-10-05 ENCOUNTER — Other Ambulatory Visit: Payer: Self-pay | Admitting: Primary Care

## 2020-10-05 DIAGNOSIS — Z1231 Encounter for screening mammogram for malignant neoplasm of breast: Secondary | ICD-10-CM

## 2020-10-30 ENCOUNTER — Other Ambulatory Visit: Payer: Self-pay | Admitting: Physician Assistant

## 2021-06-14 ENCOUNTER — Other Ambulatory Visit: Payer: Self-pay

## 2021-06-14 MED FILL — Losartan Potassium Tab 100 MG: ORAL | 90 days supply | Qty: 90 | Fill #0 | Status: AC

## 2021-06-14 MED FILL — Hydrochlorothiazide Tab 25 MG: ORAL | 90 days supply | Qty: 90 | Fill #0 | Status: AC

## 2021-06-14 MED FILL — Sertraline HCl Tab 25 MG: ORAL | 90 days supply | Qty: 90 | Fill #0 | Status: CN

## 2021-06-26 ENCOUNTER — Other Ambulatory Visit: Payer: Self-pay

## 2021-11-28 ENCOUNTER — Other Ambulatory Visit: Payer: Self-pay

## 2021-11-28 MED ORDER — LOSARTAN POTASSIUM 100 MG PO TABS
ORAL_TABLET | ORAL | 0 refills | Status: AC
  Filled 2021-11-28: qty 30, 30d supply, fill #0

## 2021-11-28 MED ORDER — SERTRALINE HCL 25 MG PO TABS
ORAL_TABLET | ORAL | 0 refills | Status: AC
  Filled 2021-11-28: qty 30, 30d supply, fill #0

## 2021-11-28 MED ORDER — HYDROCHLOROTHIAZIDE 25 MG PO TABS
ORAL_TABLET | Freq: Every day | ORAL | 0 refills | Status: AC
  Filled 2021-11-28: qty 30, 30d supply, fill #0

## 2021-11-29 ENCOUNTER — Other Ambulatory Visit: Payer: Self-pay

## 2022-01-03 ENCOUNTER — Other Ambulatory Visit: Payer: Self-pay

## 2022-01-04 ENCOUNTER — Other Ambulatory Visit: Payer: Self-pay

## 2022-01-29 ENCOUNTER — Encounter: Payer: BLUE CROSS/BLUE SHIELD | Admitting: Obstetrics & Gynecology

## 2023-01-16 ENCOUNTER — Encounter: Payer: Self-pay | Admitting: Physician Assistant

## 2023-04-15 ENCOUNTER — Other Ambulatory Visit: Payer: Self-pay | Admitting: Primary Care

## 2023-04-15 DIAGNOSIS — Z1231 Encounter for screening mammogram for malignant neoplasm of breast: Secondary | ICD-10-CM

## 2023-08-06 ENCOUNTER — Encounter (HOSPITAL_BASED_OUTPATIENT_CLINIC_OR_DEPARTMENT_OTHER): Payer: Self-pay | Admitting: Radiology

## 2023-08-06 ENCOUNTER — Inpatient Hospital Stay (HOSPITAL_BASED_OUTPATIENT_CLINIC_OR_DEPARTMENT_OTHER): Admission: RE | Admit: 2023-08-06 | Payer: BLUE CROSS/BLUE SHIELD | Source: Ambulatory Visit | Admitting: Radiology

## 2023-08-06 ENCOUNTER — Ambulatory Visit (HOSPITAL_BASED_OUTPATIENT_CLINIC_OR_DEPARTMENT_OTHER)
Admission: RE | Admit: 2023-08-06 | Discharge: 2023-08-06 | Disposition: A | Discharge status: Home / Self Care | Payer: BC Managed Care – PPO | Source: Ambulatory Visit | Attending: Primary Care | Admitting: Primary Care

## 2023-08-06 DIAGNOSIS — Z1231 Encounter for screening mammogram for malignant neoplasm of breast: Secondary | ICD-10-CM | POA: Diagnosis present

## 2024-05-03 ENCOUNTER — Encounter: Admitting: Obstetrics and Gynecology

## 2024-06-28 ENCOUNTER — Telehealth: Payer: Self-pay

## 2024-09-29 ENCOUNTER — Other Ambulatory Visit: Payer: Self-pay
